# Patient Record
Sex: Male | Born: 1985 | Race: Black or African American | Hispanic: No | Marital: Married | Smoking: Never smoker
Health system: Southern US, Community
[De-identification: ages and names within clinical notes are randomized; demographics above are authoritative.]

## PROBLEM LIST (undated history)

## (undated) DIAGNOSIS — I517 Cardiomegaly: Secondary | ICD-10-CM

## (undated) HISTORY — DX: Cardiomegaly: I51.7

---
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
## 2007-02-27 ENCOUNTER — Telehealth: Payer: Self-pay | Admitting: *Deleted

## 2007-08-02 ENCOUNTER — Ambulatory Visit: Payer: Self-pay | Admitting: Family Medicine

## 2007-08-02 ENCOUNTER — Encounter (INDEPENDENT_AMBULATORY_CARE_PROVIDER_SITE_OTHER): Payer: Self-pay | Admitting: Family Medicine

## 2007-08-02 ENCOUNTER — Ambulatory Visit (HOSPITAL_COMMUNITY): Admission: RE | Admit: 2007-08-02 | Discharge: 2007-08-02 | Payer: Self-pay | Admitting: Family Medicine

## 2007-08-03 ENCOUNTER — Telehealth (INDEPENDENT_AMBULATORY_CARE_PROVIDER_SITE_OTHER): Payer: Self-pay | Admitting: Family Medicine

## 2007-08-03 ENCOUNTER — Ambulatory Visit: Payer: Self-pay | Admitting: Family Medicine

## 2007-08-03 ENCOUNTER — Ambulatory Visit: Payer: Self-pay | Admitting: Cardiology

## 2007-08-03 LAB — CONVERTED CEMR LAB
Chlamydia, DNA Probe: POSITIVE — AB
GC Probe Amp, Genital: NEGATIVE

## 2007-08-08 ENCOUNTER — Ambulatory Visit (HOSPITAL_COMMUNITY): Admission: RE | Admit: 2007-08-08 | Discharge: 2007-08-08 | Payer: Self-pay | Admitting: Family Medicine

## 2007-08-08 ENCOUNTER — Encounter (INDEPENDENT_AMBULATORY_CARE_PROVIDER_SITE_OTHER): Payer: Self-pay | Admitting: Family Medicine

## 2008-08-24 ENCOUNTER — Emergency Department (HOSPITAL_COMMUNITY): Admission: EM | Admit: 2008-08-24 | Discharge: 2008-08-24 | Payer: Self-pay | Admitting: Emergency Medicine

## 2008-09-10 ENCOUNTER — Emergency Department (HOSPITAL_COMMUNITY): Admission: EM | Admit: 2008-09-10 | Discharge: 2008-09-11 | Payer: Self-pay | Admitting: Emergency Medicine

## 2009-03-17 IMAGING — CR DG TIBIA/FIBULA 2V*L*
4 series · 4 of 4 positions shown · non-contrast
Comparison: None.

LEFT TIBIA AND FIBULA - 2  VIEW:

CLINICAL DATA: Bilateral tibial pain in a tract runner. Evaluate for stress
fracture.

[t tib/fib ap left (1 of 2)]
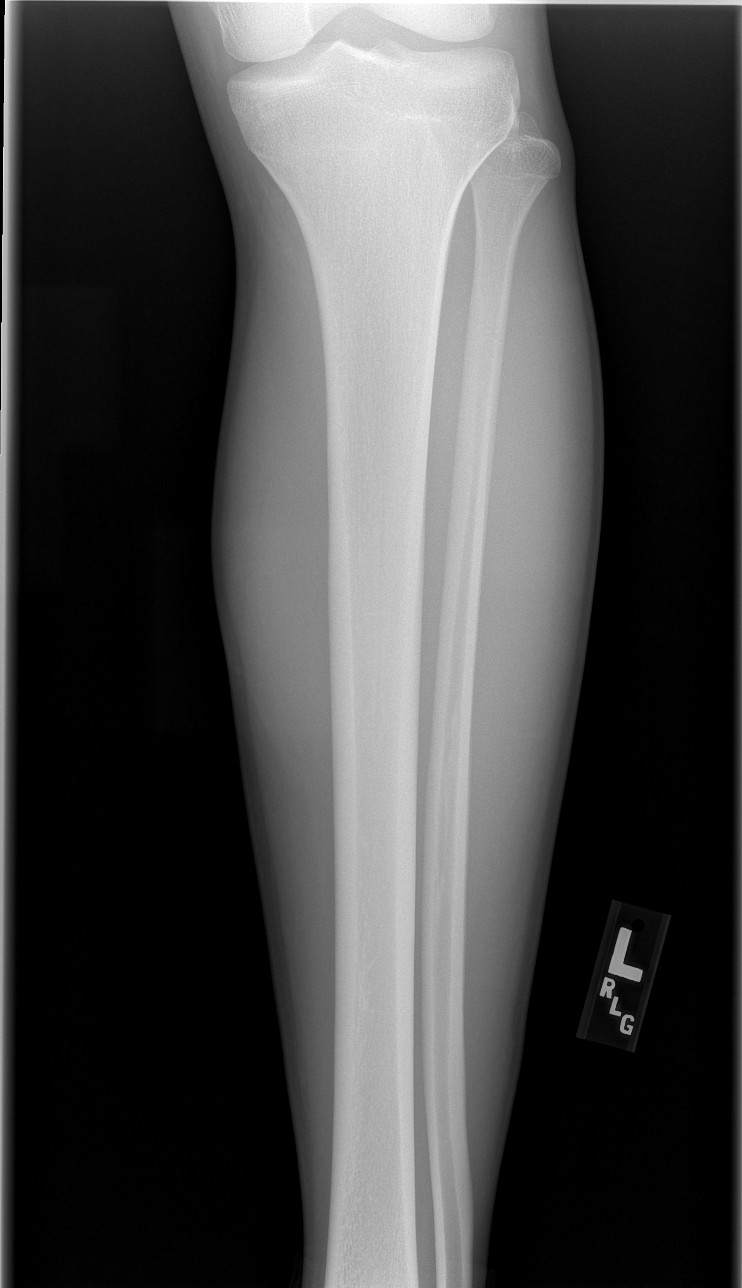

[t tib/fib ap left (2 of 2)]
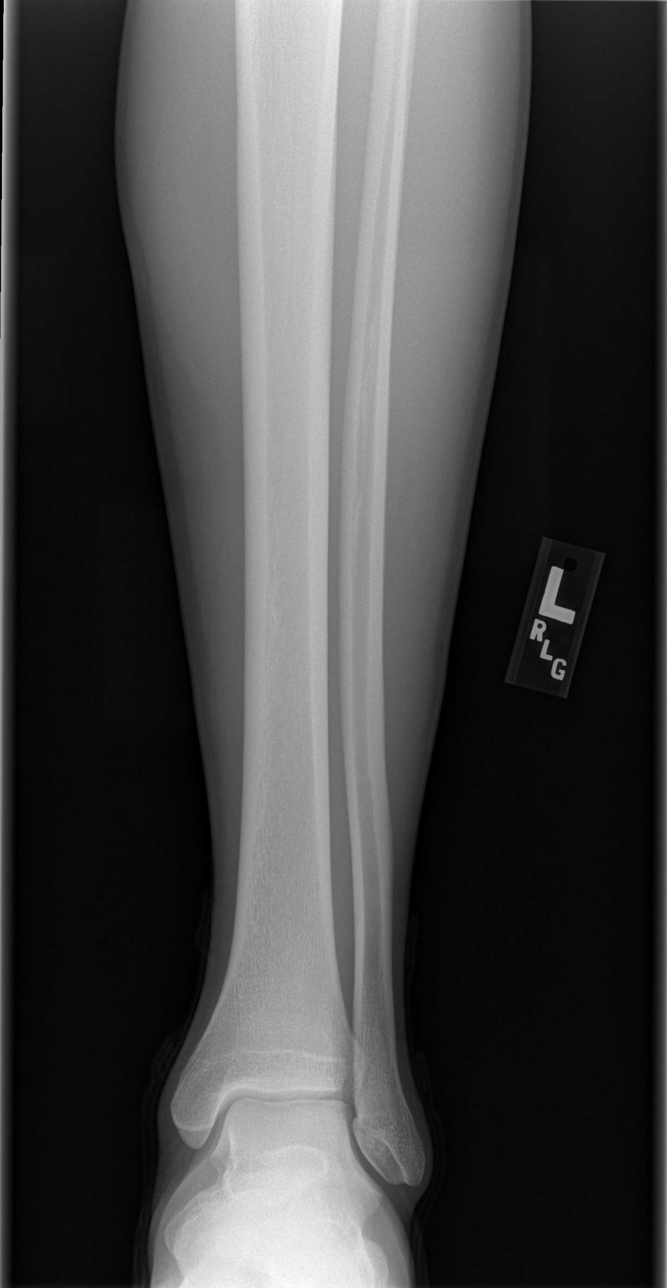

[t tib/fib lat left (1 of 2)]
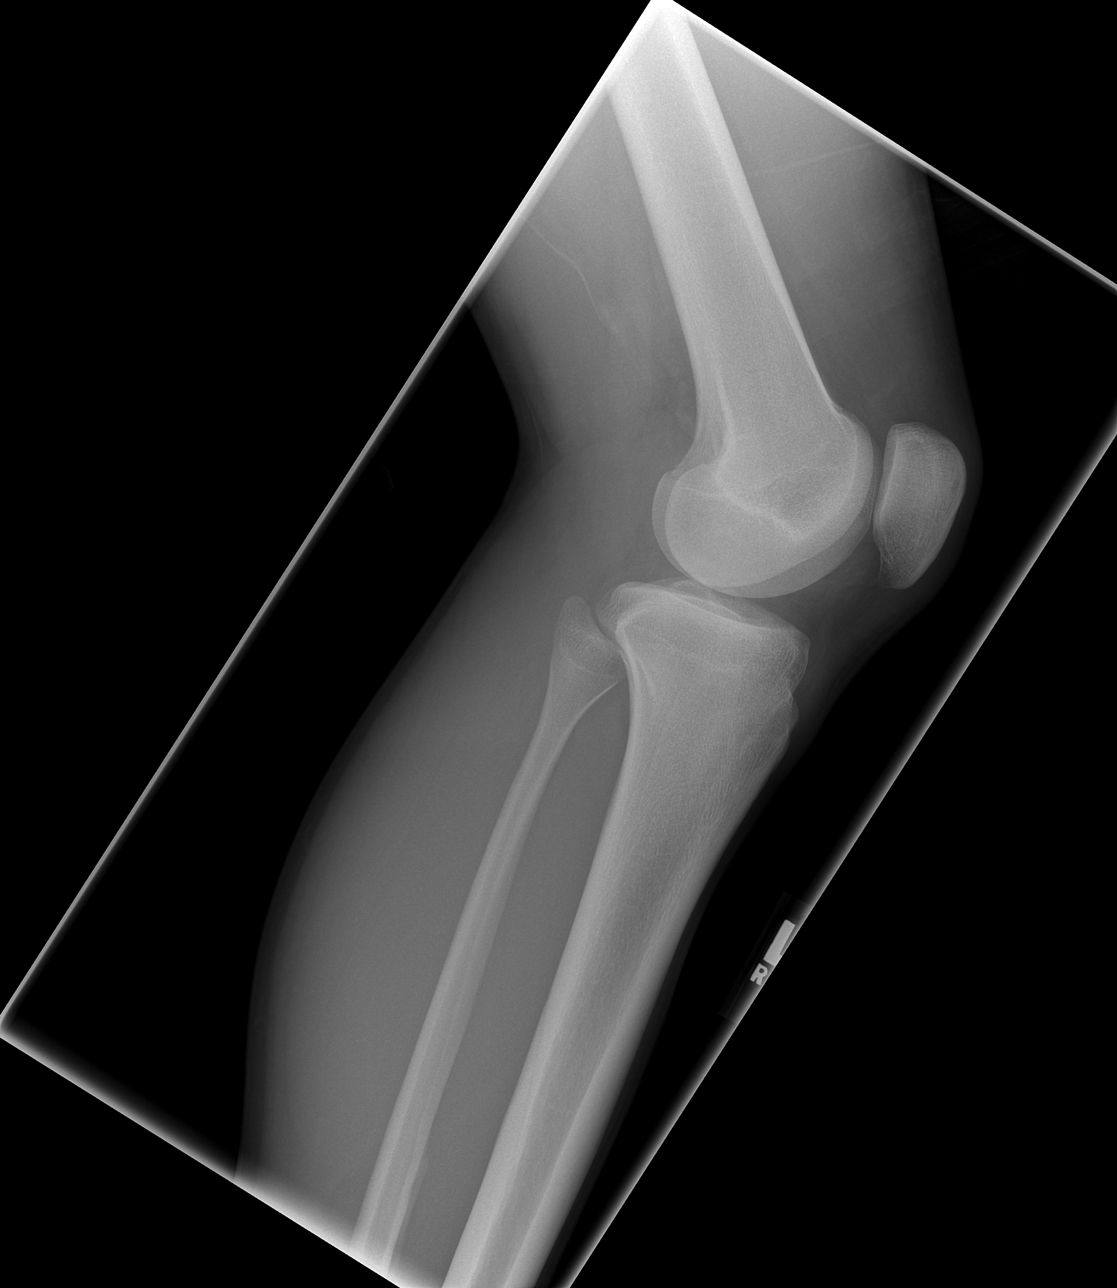

[t tib/fib lat left (2 of 2)]
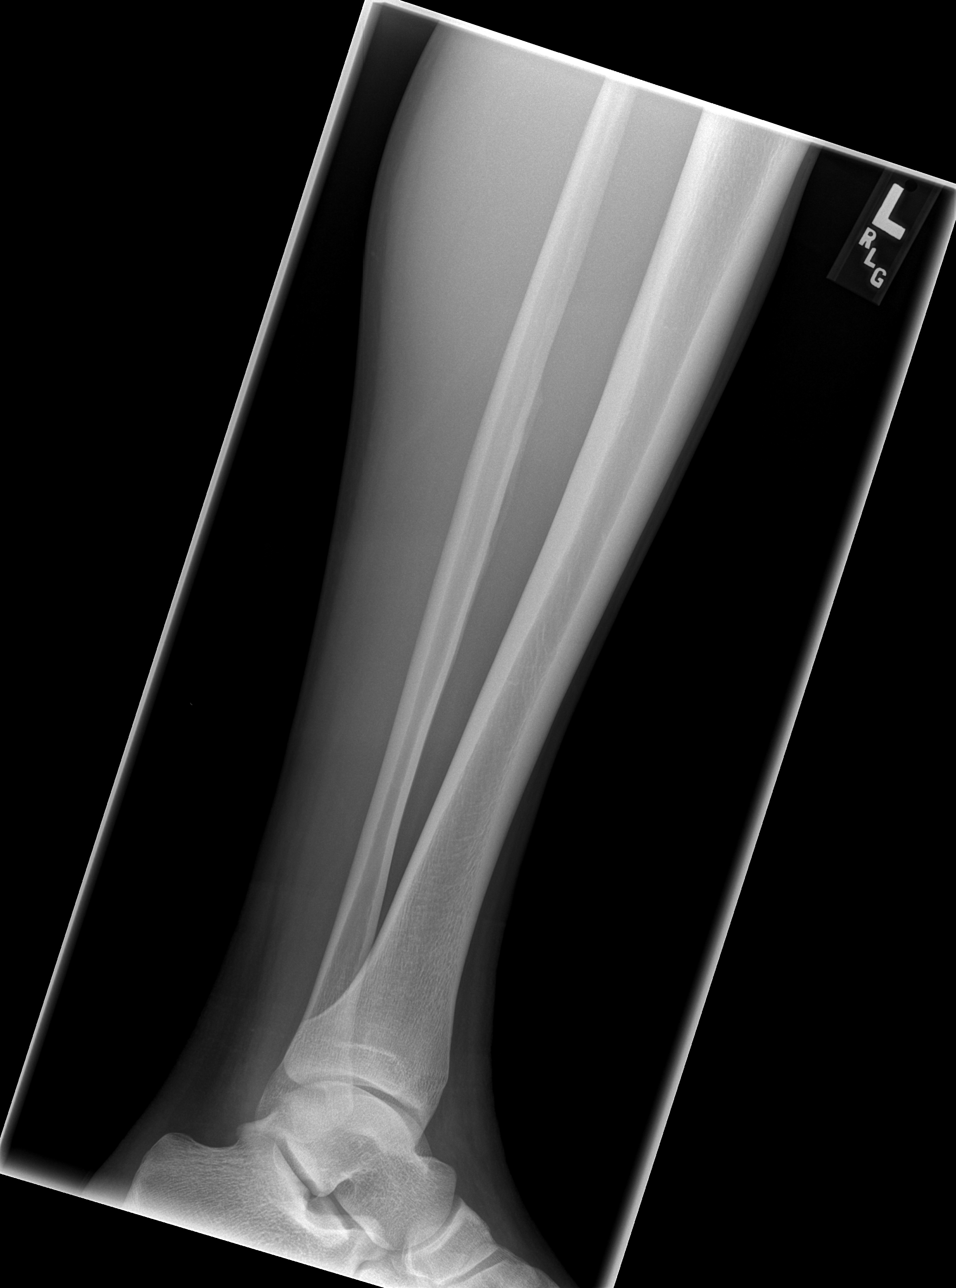

[4 of 4 positions shown; findings below may reference images not displayed]

FINDINGS: No evidence for acute fracture. No linear sclerosis to suggest the
presence of a stress fracture. No evidence for unexpected periosteal reaction.
IMPRESSION: No acute bony findings.

## 2009-06-11 ENCOUNTER — Ambulatory Visit: Payer: Self-pay | Admitting: Family Medicine

## 2009-06-11 ENCOUNTER — Encounter: Payer: Self-pay | Admitting: *Deleted

## 2009-06-11 DIAGNOSIS — S93409A Sprain of unspecified ligament of unspecified ankle, initial encounter: Secondary | ICD-10-CM | POA: Insufficient documentation

## 2010-05-30 ENCOUNTER — Encounter: Payer: Self-pay | Admitting: Family Medicine

## 2010-06-08 NOTE — Assessment & Plan Note (Signed)
Summary: sprained ankle,df   Vital Signs:  Patient profile:   25 year old male Height:      71 inches Weight:      152.4 pounds BMI:     21.33 Temp:     97.5 degrees F oral Pulse rate:   58 / minute BP sitting:   115 / 75  (left arm) Cuff size:   regular  Vitals Entered By: Gladstone Pih (June 11, 2009 11:04 AM) CC: C/O Right ankle ? sprain Is Patient Diabetic? No Pain Assessment Patient in pain? yes     Location: foot Intensity: 8 Type: sharp Comments Played BB last night and came down on someone elses foot and rolled ankle, very painful today   CC:  C/O Right ankle ? sprain.  History of Present Illness: Inversion injury yest afternoon while playing bball. came down on someone elses foot. Laced his shoe tighter and  continued to play, scoring 17 more points. Sore today but can walk on it. No prior ankle surgery  Habits & Providers  Alcohol-Tobacco-Diet     Tobacco Status: never  Allergies: No Known Drug Allergies  Physical Exam  Additional Exam:  r ankl;e mild swelling deltoid ligament area. Antyerior drawer stable, Mild pain w inversion. diffusely tender but no tru bony tenderness on malleoli ir fifth MT, No bruising.   Impression & Recommendations:  Problem # 1:  ANKLE SPRAIN, RIGHT (ICD-845.00)  will ace wrap for compression and comfort today. ICE, otc ibuprofen. I expect him to fully resolve in next 2-3 days--if not he is to RTC  Orders: [facility]- Est Level  3 ([id number])

## 2010-06-08 NOTE — Letter (Signed)
Summary: Out of Work  [facility]  [address]   [place], [place] [address]   Phone: [phone]  Fax: [phone]    June 11, 2009   Employee:  WACEY ZIEGER    To Whom It May Concern:   For Medical reasons, please excuse the above named employee from work for the following dates:  Start:   06/11/2009      End:   06/15/2009  If you need additional information, please feel free to contact our office.         Sincerely,    Loralee Pacas CMA

## 2011-02-26 ENCOUNTER — Emergency Department (HOSPITAL_COMMUNITY)
Admission: EM | Admit: 2011-02-26 | Discharge: 2011-02-26 | Disposition: A | Discharge status: Home / Self Care | Payer: Self-pay | Attending: Emergency Medicine | Admitting: Emergency Medicine

## 2011-02-26 ENCOUNTER — Emergency Department (HOSPITAL_COMMUNITY): Payer: Self-pay

## 2011-02-26 DIAGNOSIS — S62319A Displaced fracture of base of unspecified metacarpal bone, initial encounter for closed fracture: Secondary | ICD-10-CM | POA: Insufficient documentation

## 2011-02-26 DIAGNOSIS — Y9364 Activity, baseball: Secondary | ICD-10-CM | POA: Insufficient documentation

## 2011-02-26 DIAGNOSIS — W219XXA Striking against or struck by unspecified sports equipment, initial encounter: Secondary | ICD-10-CM | POA: Insufficient documentation

## 2011-02-26 DIAGNOSIS — Y998 Other external cause status: Secondary | ICD-10-CM | POA: Insufficient documentation

## 2011-03-01 ENCOUNTER — Inpatient Hospital Stay (HOSPITAL_COMMUNITY)
Admission: RE | Admit: 2011-03-01 | Discharge: 2011-03-01 | Disposition: A | Discharge status: Home / Self Care | Payer: Self-pay | Source: Ambulatory Visit | Attending: Family Medicine | Admitting: Family Medicine

## 2013-11-26 ENCOUNTER — Emergency Department (HOSPITAL_COMMUNITY)
Admission: EM | Admit: 2013-11-26 | Discharge: 2013-11-26 | Disposition: A | Discharge status: Home / Self Care | Payer: BC Managed Care – PPO | Attending: Emergency Medicine | Admitting: Emergency Medicine

## 2013-11-26 ENCOUNTER — Emergency Department (HOSPITAL_COMMUNITY): Payer: BC Managed Care – PPO

## 2013-11-26 ENCOUNTER — Encounter (HOSPITAL_COMMUNITY): Payer: Self-pay | Admitting: Emergency Medicine

## 2013-11-26 DIAGNOSIS — X58XXXA Exposure to other specified factors, initial encounter: Secondary | ICD-10-CM | POA: Insufficient documentation

## 2013-11-26 DIAGNOSIS — Z79899 Other long term (current) drug therapy: Secondary | ICD-10-CM | POA: Insufficient documentation

## 2013-11-26 DIAGNOSIS — S335XXA Sprain of ligaments of lumbar spine, initial encounter: Secondary | ICD-10-CM | POA: Insufficient documentation

## 2013-11-26 DIAGNOSIS — S39012A Strain of muscle, fascia and tendon of lower back, initial encounter: Secondary | ICD-10-CM

## 2013-11-26 DIAGNOSIS — Y929 Unspecified place or not applicable: Secondary | ICD-10-CM | POA: Insufficient documentation

## 2013-11-26 DIAGNOSIS — Y939 Activity, unspecified: Secondary | ICD-10-CM | POA: Insufficient documentation

## 2013-11-26 MED ORDER — KETOROLAC TROMETHAMINE 10 MG PO TABS
10.0000 mg | ORAL_TABLET | Freq: Once | ORAL | Status: AC
  Administered 2013-11-26: 10 mg via ORAL
  Filled 2013-11-26: qty 1

## 2013-11-26 MED ORDER — HYDROCODONE-ACETAMINOPHEN 5-325 MG PO TABS
2.0000 | ORAL_TABLET | Freq: Once | ORAL | Status: AC
  Administered 2013-11-26: 2 via ORAL
  Filled 2013-11-26: qty 2

## 2013-11-26 MED ORDER — KETOROLAC TROMETHAMINE 10 MG PO TABS: 10.0000 mg | ORAL_TABLET | Freq: Three times a day (TID) | ORAL | Status: DC

## 2013-11-26 MED ORDER — METHOCARBAMOL 500 MG PO TABS: 500.0000 mg | ORAL_TABLET | Freq: Three times a day (TID) | ORAL | Status: DC

## 2013-11-26 MED ORDER — HYDROCODONE-ACETAMINOPHEN 5-325 MG PO TABS: 1.0000 | ORAL_TABLET | ORAL | Status: DC | PRN

## 2013-11-26 MED ORDER — DIAZEPAM 5 MG PO TABS
5.0000 mg | ORAL_TABLET | Freq: Once | ORAL | Status: AC
  Administered 2013-11-26: 5 mg via ORAL
  Filled 2013-11-26: qty 1

## 2013-11-26 NOTE — Discharge Instructions (Signed)
The x-ray of your back is negative for any fracture, or any changes and disc space. Suspect that you have a muscle strain involving your lumbar region. Please rest your back is much as possible. Please apply heat to your lower back. Please use Toradol and Robaxin daily. Use Norco for pain if needed. Norco and Robaxin may cause drowsiness, please use with caution. Please see your primary physician for additional evaluation and possible MRI if you're back pain continues. Back Pain, Adult Low back pain is very common. About 1 in 5 people have back pain.The cause of low back pain is rarely dangerous. The pain often gets better over time.About half of people with a sudden onset of back pain feel better in just 2 weeks. About 8 in 10 people feel better by 6 weeks.  CAUSES Some common causes of back pain include:  Strain of the muscles or ligaments supporting the spine.  Wear and tear (degeneration) of the spinal discs.  Arthritis.  Direct injury to the back. DIAGNOSIS Most of the time, the direct cause of low back pain is not known.However, back pain can be treated effectively even when the exact cause of the pain is unknown.Answering your caregiver's questions about your overall health and symptoms is one of the most accurate ways to make sure the cause of your pain is not dangerous. If your caregiver needs more information, he or she may order lab work or imaging tests (X-rays or MRIs).However, even if imaging tests show changes in your back, this usually does not require surgery. HOME CARE INSTRUCTIONS For many people, back pain returns.Since low back pain is rarely dangerous, it is often a condition that people can learn to [facility]manageon their own.   Remain active. It is stressful on the back to sit or stand in one place. Do not sit, drive, or stand in one place for more than 30 minutes at a time. Take short walks on level surfaces as soon as pain allows.Try to increase the length of time you walk  each day.  Do not stay in bed.Resting more than 1 or 2 days can delay your recovery.  Do not avoid exercise or work.Your body is made to move.It is not dangerous to be active, even though your back may hurt.Your back will likely heal faster if you return to being active before your pain is gone.  Pay attention to your body when you bend and lift. Many people have less discomfortwhen lifting if they bend their knees, keep the load close to their bodies,and avoid twisting. Often, the most comfortable positions are those that put less stress on your recovering back.  Find a comfortable position to sleep. Use a firm mattress and lie on your side with your knees slightly bent. If you lie on your back, put a pillow under your knees.  Only take over-the-counter or prescription medicines as directed by your caregiver. Over-the-counter medicines to reduce pain and inflammation are often the most helpful.Your caregiver may prescribe muscle relaxant drugs.These medicines help dull your pain so you can more quickly return to your normal activities and healthy exercise.  Put ice on the injured area.  Put ice in a plastic bag.  Place a towel between your skin and the bag.  Leave the ice on for 15-20 minutes, 03-04 times a day for the first 2 to 3 days. After that, ice and heat may be alternated to reduce pain and spasms.  Ask your caregiver about trying back exercises and gentle massage.  This may be of some benefit.  Avoid feeling anxious or stressed.Stress increases muscle tension and can worsen back pain.It is important to recognize when you are anxious or stressed and learn ways to manage it.Exercise is a great option. SEEK MEDICAL CARE IF:  You have pain that is not relieved with rest or medicine.  You have pain that does not improve in 1 week.  You have new symptoms.  You are generally not feeling well. SEEK IMMEDIATE MEDICAL CARE IF:   You have pain that radiates from your back  into your legs.  You develop new bowel or bladder control problems.  You have unusual weakness or numbness in your arms or legs.  You develop nausea or vomiting.  You develop abdominal pain.  You feel faint. Document Released: 04/25/2005 Document Revised: 10/25/2011 Document Reviewed: 09/13/2010 [facility] Patient Information 2015 [place], [place]. This information is not intended to replace advice given to you by your health care provider. Make sure you discuss any questions you have with your health care provider.

## 2013-11-26 NOTE — ED Notes (Signed)
Lower right back pain for 2 weeks, worse with movement or sitting.

## 2013-11-26 NOTE — ED Notes (Signed)
Pt verbalized understanding to use caution and no driving within 4 hours of taking vicodin due to med causes drowsiness  

## 2013-11-27 NOTE — ED Provider Notes (Signed)
CSN: [id number]     Arrival date & time 11/26/13  1939 History   First MD Initiated Contact with Patient 11/26/13 2020     Chief Complaint  Patient presents with  . Back Pain     (Consider location/radiation/quality/duration/timing/severity/associated sxs/prior Treatment) Patient is a 28 y.o. male presenting with back pain. The history is provided by the patient.  Back Pain Location:  Lumbar spine Quality:  Shooting (sharpe pain reported) Radiates to:  Does not radiate Pain severity:  Moderate Pain is:  Same all the time Onset quality:  Gradual Duration:  2 weeks Timing:  Intermittent Progression:  Unchanged Context comment:  No reported injury or recent  problem.  Relieved by:  Nothing Worsened by:  Movement Associated symptoms: no abdominal pain, no bladder incontinence, no bowel incontinence, no chest pain and no dysuria   Risk factors: no recent surgery     History reviewed. No pertinent past medical history. History reviewed. No pertinent past surgical history. No family history on file. History  Substance Use Topics  . Smoking status: Never Smoker   . Smokeless tobacco: Not on file  . Alcohol Use: No    Review of Systems  Constitutional: Negative for activity change.       All ROS Neg except as noted in HPI  HENT: Negative for nosebleeds.   Eyes: Negative for photophobia and discharge.  Respiratory: Negative for cough, shortness of breath and wheezing.   Cardiovascular: Negative for chest pain and palpitations.  Gastrointestinal: Negative for abdominal pain, blood in stool and bowel incontinence.  Genitourinary: Negative for bladder incontinence, dysuria, frequency and hematuria.  Musculoskeletal: Positive for back pain. Negative for arthralgias and neck pain.  Skin: Negative.   Neurological: Negative for dizziness, seizures and speech difficulty.  Psychiatric/Behavioral: Negative for hallucinations and confusion.      Allergies  Review of patient's  allergies indicates no known allergies.  Home Medications   Prior to Admission medications   Medication Sig Start Date End Date Taking? Authorizing Provider  HYDROcodone-acetaminophen (NORCO/VICODIN) 5-325 MG per tablet Take 1 tablet by mouth every 4 (four) hours as needed. 11/26/13   Kathie Dike, PA-C  ketorolac (TORADOL) 10 MG tablet Take 1 tablet (10 mg total) by mouth 3 (three) times daily. 11/26/13   Kathie Dike, PA-C  methocarbamol (ROBAXIN) 500 MG tablet Take 1 tablet (500 mg total) by mouth 3 (three) times daily. 11/26/13   Kathie Dike, PA-C   BP 125/88  Pulse 81  Temp(Src) 98.9 F (37.2 C) (Oral)  Resp 16  Ht 6\' 1"  (1.854 m)  Wt 180 lb (81.647 kg)  BMI 23.75 kg/m2  SpO2 97% Physical Exam  Nursing note and vitals reviewed. Constitutional: He is oriented to person, place, and time. He appears well-developed and well-nourished.  Non-toxic appearance.  HENT:  Head: Normocephalic.  Right Ear: Tympanic membrane and external ear normal.  Left Ear: Tympanic membrane and external ear normal.  Eyes: EOM and lids are normal. Pupils are equal, round, and reactive to light.  Neck: Normal range of motion. Neck supple. Carotid bruit is not present.  Cardiovascular: Normal rate, regular rhythm, normal heart sounds, intact distal pulses and normal pulses.   Pulmonary/Chest: Breath sounds normal. No respiratory distress.  Abdominal: Soft. Bowel sounds are normal. There is no tenderness. There is no guarding.  Musculoskeletal:       Lumbar back: He exhibits decreased range of motion, tenderness, pain and spasm.  Lymphadenopathy:       Head (  right side): No submandibular adenopathy present.       Head (left side): No submandibular adenopathy present.    He has no cervical adenopathy.  Neurological: He is alert and oriented to person, place, and time. He has normal strength. No cranial nerve deficit or sensory deficit.  Skin: Skin is warm and dry.  Psychiatric: He has a normal  mood and affect. His speech is normal.    ED Course  Procedures (including critical care time) Labs Review Labs Reviewed - No data to display  Imaging Review Dg Lumbar Spine Complete  11/26/2013   CLINICAL DATA:  Right low back pain, no injury  EXAM: LUMBAR SPINE - COMPLETE 4+ VIEW  COMPARISON:  None.  FINDINGS: Normal anterior-posterior alignment. No significant degenerative change. No fracture. No soft tissue abnormalities.  IMPRESSION: Negative.   Electronically Signed   By: Esperanza Heiraymond  Rubner M.D.   On: 11/26/2013 21:27     EKG Interpretation None      MDM X-ray of the lower back and is negative for any fracture or significant degenerative change. The vital signs are within normal limits. The pulse oximetry is 97% on room air. No acute gross neurologic deficit appreciated at this time. The patient will be treated with Norco, Toradol, and Robaxin. Patient is to return to the primary care physician office for recheck and evaluation.    Final diagnoses:  Lumbar strain, initial encounter    **I have reviewed nursing notes, vital signs, and all appropriate lab and imaging results for this patient.Kathie Dike*    Burma Ketcher M Caya Soberanis, PA-C 11/27/13 0140

## 2013-11-30 NOTE — ED Provider Notes (Signed)
Medical screening examination/treatment/procedure(s) were performed by non-physician practitioner and as supervising physician I was immediately available for consultation/collaboration.   EKG Interpretation None       Zerick Prevette M Pavneet Markwood, MD 11/30/13 0058 

## 2015-02-16 ENCOUNTER — Encounter (HOSPITAL_COMMUNITY): Payer: Self-pay | Admitting: Emergency Medicine

## 2015-02-16 ENCOUNTER — Emergency Department (HOSPITAL_COMMUNITY): Payer: 59

## 2015-02-16 ENCOUNTER — Emergency Department (HOSPITAL_COMMUNITY)
Admission: EM | Admit: 2015-02-16 | Discharge: 2015-02-16 | Disposition: A | Discharge status: Home / Self Care | Payer: 59 | Attending: Emergency Medicine | Admitting: Emergency Medicine

## 2015-02-16 DIAGNOSIS — Z79899 Other long term (current) drug therapy: Secondary | ICD-10-CM | POA: Insufficient documentation

## 2015-02-16 DIAGNOSIS — M79672 Pain in left foot: Secondary | ICD-10-CM | POA: Insufficient documentation

## 2015-02-16 MED ORDER — IBUPROFEN 800 MG PO TABS: 800.0000 mg | ORAL_TABLET | Freq: Three times a day (TID) | ORAL | Status: DC

## 2015-02-16 NOTE — ED Notes (Signed)
Per pt, states a large guy fell on him 3 weeks ago while playing baseball

## 2015-02-16 NOTE — Discharge Instructions (Signed)
1. Medications: ibuprofen, usual home medications 2. Treatment: rest, drink plenty of fluids, ice, elevate 3. Follow Up: please followup with orthopedics if your symptoms do not improve for discussion of your diagnoses and further evaluation after today's visit; please return to the ER for severe pain or swelling, numbness, color change, new or worsening symptoms   Musculoskeletal Pain Musculoskeletal pain is muscle and boney aches and pains. These pains can occur in any part of the body. Your caregiver may treat you without knowing the cause of the pain. They may treat you if blood or urine tests, X-rays, and other tests were normal.  CAUSES There is often not a definite cause or reason for these pains. These pains may be caused by a type of germ (virus). The discomfort may also come from overuse. Overuse includes working out too hard when your body is not fit. Boney aches also come from weather changes. Bone is sensitive to atmospheric pressure changes. HOME CARE INSTRUCTIONS   Ask when your test results will be ready. Make sure you get your test results.  Only take over-the-counter or prescription medicines for pain, discomfort, or fever as directed by your caregiver. If you were given medications for your condition, do not drive, operate machinery or power tools, or sign legal documents for 24 hours. Do not drink alcohol. Do not take sleeping pills or other medications that may interfere with treatment.  Continue all activities unless the activities cause more pain. When the pain lessens, slowly resume normal activities. Gradually increase the intensity and duration of the activities or exercise.  During periods of severe pain, bed rest may be helpful. Lay or sit in any position that is comfortable.  Putting ice on the injured area.  Put ice in a bag.  Place a towel between your skin and the bag.  Leave the ice on for 15 to 20 minutes, 3 to 4 times a day.  Follow up with your caregiver  for continued problems and no reason can be found for the pain. If the pain becomes worse or does not go away, it may be necessary to repeat tests or do additional testing. Your caregiver may need to look further for a possible cause. SEEK IMMEDIATE MEDICAL CARE IF:  You have pain that is getting worse and is not relieved by medications.  You develop chest pain that is associated with shortness or breath, sweating, feeling sick to your stomach (nauseous), or throw up (vomit).  Your pain becomes localized to the abdomen.  You develop any new symptoms that seem different or that concern you. MAKE SURE YOU:   Understand these instructions.  Will watch your condition.  Will get help right away if you are not doing well or get worse.   This information is not intended to replace advice given to you by your health care provider. Make sure you discuss any questions you have with your health care provider.   Document Released: 04/25/2005 Document Revised: 07/18/2011 Document Reviewed: 12/28/2012 Elsevier Interactive Patient Education 2016 [employer].   Emergency Department Resource Guide 1) Find a Doctor and Pay Out of Pocket Although you won't have to find out who is covered by your insurance plan, it is a good idea to ask around and get recommendations. You will then need to call the office and see if the doctor you have chosen will accept you as a new patient and what types of options they offer for patients who are self-pay. Some doctors offer discounts or  will set up payment plans for their patients who do not have insurance, but you will need to ask so you aren't surprised when you get to your appointment.  2) Contact Your Local Health Department Not all health departments have doctors that can see patients for sick visits, but many do, so it is worth a call to see if yours does. If you don't know where your local health department is, you can check in your phone book. The CDC also has  a tool to help you locate your state's health department, and many state websites also have listings of all of their local health departments.  3) Find a Walk-in Clinic If your illness is not likely to be very severe or complicated, you may want to try a walk in clinic. These are popping up all over the country in pharmacies, drugstores, and shopping centers. They're usually staffed by nurse practitioners or physician assistants that have been trained to treat common illnesses and complaints. They're usually fairly quick and inexpensive. However, if you have serious medical issues or chronic medical problems, these are probably not your best option.  No Primary Care Doctor: - Call Health Connect at  [phone] - they can help you locate a primary care doctor that  accepts your insurance, provides certain services, etc. - Physician Referral Service- [phone]  Chronic Pain Problems: Organization         Address  Phone   Notes  [facility]  [phone] Patients need to be referred by their primary care doctor.   Medication Assistance: Organization         Address  Phone   Notes  [facility] Medication [facility] [address] [place] [place], [place] [address] [phone] --Must be a resident of [facility] -- Must have NO insurance coverage whatsoever (no Medicaid/ Medicare, etc.) -- The pt. MUST have a primary care doctor that directs their care regularly and follows them in the community   [facility]  [phone]   [employer]  [phone]    Agencies that provide inexpensive medical care: Organization         Address  Phone   Notes  [facility]  [phone]   [facility]    [phone]   [facility] [address] [place], [place] [address] [phone]   [facility] [address] [place] [place] [phone]   Planned Parenthood    [phone]    [facility]    [phone]   [facility]  [address], [place] Phone:  [phone], Fax:  [phone] Hours of Operation:  9 am - 6 pm, M-F.  Also accepts Medicaid/Medicare and self-pay.  [facility]  [address], [place] Phone: [phone], Fax: [phone]. Hours of Operation:  8:30 am - 5:30 pm, M-F.  Also accepts Medicaid and self-pay.  [facility] [place] [address], [place] Phone: [phone]   [facility] [address] Natasha Bence [place], [place] [phone], Ext. [phone] Mondays & Thursdays: 7-9 AM.  First 15 patients are seen on a first come, first serve basis.    Medicaid-accepting [facility] Providers:  Organization         Address  Phone   Notes  [facility] [address], [place] [phone])  [phone] Also accepts self-pay patients.  [facility] [address] Laurell Josephs [place], [place]  [phone]   [facility] [address], [place] [phone]   [facility] [address] [place] [phone]   Renaye Rakers [address], [place]   [phone] Only accepts [place] Access [place] patients after they have their name applied to their card.   Self-Pay (no insurance) in [facility]:  Organization         Address  Phone   Notes  Sickle Cell Patients, [facility] [address] [place], [place] [phone]   [facility] [address] [place], [place] [phone]   [facility] [place]  [address], [place] [phone]   [facility]/Dr. Osei-Bonsu  [address], [place] or [address], [place] [phone] Phone number for both [place] and [place] locations is the same.  Urgent Medical and [facility] [address] [place] [phone]   [facility] [address] [place] or [address] [phone]   [facility] [address] [place] [phone], phone; [phone], fax Sees patients 1st and 3rd Saturday of every month.  Must not qualify for public or private insurance (i.e. Medicaid, Medicare, [place] Health Choice, Veterans' Benefits)  Household income should be no more than 200% of the poverty level The clinic cannot treat you if you are pregnant or think you are pregnant  Sexually transmitted diseases are not treated at the clinic.    Dental Care: Organization         Address  Phone  Notes  [facility] Department of [facility] [address] [place], [place] [phone] Accepts children up to age 26 who are enrolled in [place] or [place] Health Choice; pregnant women with a Medicaid card; and children who have applied for Medicaid or [place] Health Choice, but were declined, whose parents can pay a reduced fee at time of service.  [facility] Department of [facility]  [address], [place] [phone] Accepts children up to age 47 who are enrolled in [place] or [place] Health Choice; pregnant women with a Medicaid card; and children who have applied for Medicaid or [place] Health Choice, but were declined, whose parents can pay a reduced fee at time of service.  [facility]  [address] [place], [place] [phone] Patients are seen by appointment only. Walk-ins are not accepted. [facility] will see patients 69 years of age and older. Monday - Tuesday (8am-5pm) Most Wednesdays (8:30-5pm) $30 per visit, cash only  [facility] Adult Dental Access PROGRAM  [address], [facility] [phone] Patients are seen by appointment only. Walk-ins are not accepted. [facility] will see patients 74 years of age and older. One Wednesday  Evening (Monthly: Volunteer Based).  $30 per visit, cash only  [employer]  [phone] for adults; Children under age 34, call Graduate Pediatric Dentistry at [phone]. Children aged 31-14, please call [phone] to request a pediatric application.  Dental services are provided in all areas of dental care including fillings, crowns and bridges, complete and partial dentures, implants, gum treatment, root canals, and extractions. Preventive care is also provided.  Treatment is provided to both adults and children. Patients are selected via a lottery and there is often a waiting list.   [facility] [address], [place]  [phone] [email or web address]   [facility] [address] [place], [place] [phone], Ext. [phone] Second and Fourth Thursday of each month, opens at 6:30 AM; Clinic ends at 9 AM.  Patients are seen on a first-come first-served basis, and a limited number are seen during each clinic.   [facility]  [address] Ether Griffins [place], [place] [phone]   Eligibility Requirements You must have lived in [place], [place], or [place] counties for at least the last three months.   You cannot be eligible for state or federal sponsored [place], including [employer], [place], or [employer].   You generally cannot be eligible for healthcare insurance through your employer.    How to apply: Eligibility screenings are held every Tuesday and Wednesday afternoon from 1:00 pm until 4:00 pm. You do not need an appointment for the interview!  [facility] [address] [place], [place] [phone]   [facility] Health Department  [phone]   [facility] Health Department  [phone]   [facility] Health Department  [phone]    Behavioral Health Resources in the Community: Intensive Outpatient Programs Organization         Address  Phone  Notes  [facility] [address], [place], [place] [phone]   [facility] [place] [facility] Out[facility] [address] [place], [place] [phone]   [facility] [address] [place], [place]  [phone]   [facility] [address],  [place], [place] [phone] or [phone]   Substance Abuse Resources Organization         Address  Phone  Notes  [facility]  [phone]   [facility]  [phone]   [facility] [place]  [phone]   Floydene Flock  [phone]   Residential & Outpatient Substance Abuse Program  [phone]   Psychological Services Organization         Address  Phone  Notes  [facility] Behavioral Health  [phone]   [facility]  [phone]   [facility] Mental Health [address] [place] [phone] or [phone]    Mobile Crisis Teams Organization         Address  Phone  Notes  [facility], Mobile Crisis Care Unit  [phone]   [facility]  [address] [place], [place] [phone]   Doristine Locks [address] [place] [phone]    Self-Help/Support Groups Organization         Address  Phone             Notes  [facility] [place] - variety of support groups  336- I7437963 Call for more information  Narcotics Anonymous (NA), [facility] [address], [employer] [place]  2 meetings at this location   Statistician         Address  Phone  Notes  [facility] [address],    [place]  [phone]   [facility]  [address], [place] [address], [place], [place] [phone]   [facility] [address] [place], [place] [phone] Admissions: 8am-3pm M-F  [facility] [address]    [place],  [place] [phone]   [facility] [address] Starling Manns  [place], [place] [phone]   [facility] [address]  [place], [place] [phone]   [facility] - Intensive Outpatient [address] Laurell Josephs [address], [place], [place] [phone]   [facility] ([facility] [address] [place]  [place], [place] [phone] or 8548795182   [facility] ([facility]) [address] [place], [place] [phone] Accepts Medicaid  [facility] [address]  [place] [phone] Substance Abuse/Addiction Treatment   [facility] Organization         Address  Phone  Notes  [facility]  5717139296   Angie Fava, PhD [address] Ervin Knack [place], [place]   [phone] or [phone]   [facility]   [address] [place], [place] [phone]   [facility] [address] [place], [place] [phone] Insurance/Medicaid/sponsorship through [facility] and Families [address]                                    [place], [place] [phone] Therapy/tele-psych/case  [facility] [address], [place] [phone]    Dr. Lolly Mustache  [phone]   [facility]  [employer] [facility] Dept. 1) [address] [place] 2) [address], [place] 3)  [address], [place] [phone]  [phone]   [facility] Child Abuse Hotline (863)082-4781 or [phone] (After Hours)

## 2015-02-16 NOTE — ED Provider Notes (Signed)
CSN: [id number]     Arrival date & time 02/16/15  1332 History  By signing my name below, I, Elon Spanner, attest that this documentation has been prepared under the direction and in the presence of Glean Hess, [place]. Electronically Signed: Elon Spanner ED Scribe. 02/16/2015. 4:07 PM.    Chief Complaint  Patient presents with  . Foot Pain   The history is provided by the patient. No language interpreter was used.    HPI Comments: Billy Peters is a 29 y.o. male who presents to the Emergency Department complaining of intermittent, moderate left foot pain, which started 3 weeks ago while playing basketball. He states his symptoms have been constant since that time. He reports his pain is worse in the morning. He denies alleviating factors. He denies numbness, weakness, paresthesia.   History reviewed. No pertinent past medical history. History reviewed. No pertinent past surgical history. No family history on file. Social History  Substance Use Topics  . Smoking status: Never Smoker   . Smokeless tobacco: None  . Alcohol Use: No     Review of Systems  Musculoskeletal: Positive for arthralgias. Negative for back pain, joint swelling, gait problem and neck pain.  Skin: Negative for color change.  Neurological: Negative for weakness and numbness.      Allergies  Review of patient's allergies indicates no known allergies.  Home Medications   Prior to Admission medications   Medication Sig Start Date End Date Taking? Authorizing Provider  HYDROcodone-acetaminophen (NORCO/VICODIN) 5-325 MG per tablet Take 1 tablet by mouth every 4 (four) hours as needed. 11/26/13   Ivery Quale, PA-C  ketorolac (TORADOL) 10 MG tablet Take 1 tablet (10 mg total) by mouth 3 (three) times daily. 11/26/13   Ivery Quale, PA-C  methocarbamol (ROBAXIN) 500 MG tablet Take 1 tablet (500 mg total) by mouth 3 (three) times daily. 11/26/13   Ivery Quale, PA-C    BP 130/79 mmHg  Pulse 67   Temp(Src) 98.2 F (36.8 C) (Oral)  Resp 16  SpO2 100% Physical Exam  Constitutional: He is oriented to person, place, and time. He appears well-developed and well-nourished. No distress.  HENT:  Head: Normocephalic and atraumatic.  Right Ear: External ear normal.  Left Ear: External ear normal.  Nose: Nose normal.  Mouth/Throat: Oropharynx is clear and moist.  Eyes: Conjunctivae and EOM are normal. Pupils are equal, round, and reactive to light. Right eye exhibits no discharge. Left eye exhibits no discharge. No scleral icterus.  Neck: Normal range of motion. Neck supple.  Cardiovascular: Normal rate and regular rhythm.   Pulmonary/Chest: Effort normal and breath sounds normal. No respiratory distress.  Musculoskeletal: Normal range of motion. He exhibits tenderness. He exhibits no edema.  Mild TTP of lateral dorsal aspect of left foot. Full range of motion. Strength and sensation intact. Distal pulses intact. Cap refill <3 seconds.  Neurological: He is alert and oriented to person, place, and time. He has normal strength. No sensory deficit.  Skin: Skin is warm and dry. He is not diaphoretic. No erythema.  Psychiatric: He has a normal mood and affect. His behavior is normal.  Nursing note and vitals reviewed.   ED Course  Procedures (including critical care time)  DIAGNOSTIC STUDIES: Oxygen Saturation is 100% on RA, normal by my interpretation.    COORDINATION OF CARE: 4:07 PM Discussed treatment plan with patient at bedside.  Patient acknowledges and agrees with plan.   Labs Review Labs Reviewed - No data to display  Imaging  Review Dg Foot Complete Left  02/16/2015   CLINICAL DATA:  A person fell on the patient's left foot 3 weeks ago. Persistent pain proximally along the left fourth and fifth metatarsal region.  EXAM: LEFT FOOT - COMPLETE 3+ VIEW  COMPARISON:  None.  FINDINGS: Lisfranc joint alignment normal. No metatarsal fracture is identified. There may be mild soft tissue  swelling in the vicinity of the fifth MTP joint, but no foreign body is observed.  IMPRESSION: 1. No acute bony findings. If pain persists despite conservative therapy, MRI may be warranted for further characterization. 2. Potential mild soft tissue swelling along the lateral margin of the fifth MTP joint.   Electronically Signed   By: Gaylyn Rong M.D.   On: 02/16/2015 14:36   I have personally reviewed and evaluated these image results as part of my medical decision-making.   EKG Interpretation None      MDM   Final diagnoses:  Left foot pain    29 year male presents with left foot pain x 3 weeks. He denies numbness, weakness, paresthesia.   Patient is afebrile. Vital signs stable. Mild TTP of lateral dorsal aspect of left foot. Full range of motion. Strength and sensation intact. Distal pulses intact. Cap refill <3 seconds.  Imaging of left foot negative for acute bony findings, demonstrates potential mild soft tissue swelling along lateral margin of 5th MTP.  Patient given post-op shoe. Will treat pain with ibuprofen. Patient to follow-up with orthopedist for persistent symptoms. Return precautions discussed.  I personally performed the services described in this documentation, which was scribed in my presence. The recorded information has been reviewed and is accurate.  BP 130/79 mmHg  Pulse 67  Temp(Src) 98.2 F (36.8 C) (Oral)  Resp 16  SpO2 100%     Mady Gemma, PA-C 02/16/15 2339  Benjiman Core, MD 02/17/15 [id number]

## 2020-05-25 ENCOUNTER — Encounter (HOSPITAL_COMMUNITY): Payer: Self-pay | Admitting: Emergency Medicine

## 2020-05-25 ENCOUNTER — Emergency Department (HOSPITAL_COMMUNITY)
Admission: EM | Admit: 2020-05-25 | Discharge: 2020-05-25 | Disposition: A | Discharge status: Home / Self Care | Payer: HRSA Program | Attending: Emergency Medicine | Admitting: Emergency Medicine

## 2020-05-25 ENCOUNTER — Other Ambulatory Visit: Payer: Self-pay

## 2020-05-25 DIAGNOSIS — M549 Dorsalgia, unspecified: Secondary | ICD-10-CM | POA: Diagnosis present

## 2020-05-25 DIAGNOSIS — U071 COVID-19: Secondary | ICD-10-CM | POA: Insufficient documentation

## 2020-05-25 DIAGNOSIS — B349 Viral infection, unspecified: Secondary | ICD-10-CM

## 2020-05-25 LAB — SARS CORONAVIRUS 2 (TAT 6-24 HRS): SARS Coronavirus 2: POSITIVE — AB

## 2020-05-25 LAB — URINALYSIS, ROUTINE W REFLEX MICROSCOPIC
Bilirubin Urine: NEGATIVE
Glucose, UA: NEGATIVE mg/dL
Hgb urine dipstick: NEGATIVE
Ketones, ur: NEGATIVE mg/dL
Leukocytes,Ua: NEGATIVE
Nitrite: NEGATIVE
Protein, ur: NEGATIVE mg/dL
Specific Gravity, Urine: 1.017 (ref 1.005–1.030)
pH: 7 (ref 5.0–8.0)

## 2020-05-25 LAB — POC SARS CORONAVIRUS 2 AG -  ED: SARS Coronavirus 2 Ag: NEGATIVE

## 2020-05-25 MED ORDER — ACETAMINOPHEN 500 MG PO TABS: 1000.0000 mg | ORAL_TABLET | Freq: Once | ORAL | Status: DC

## 2020-05-25 MED ORDER — IBUPROFEN 800 MG PO TABS
800.0000 mg | ORAL_TABLET | Freq: Once | ORAL | Status: AC
  Administered 2020-05-25: 800 mg via ORAL
  Filled 2020-05-25: qty 1

## 2020-05-25 NOTE — ED Notes (Signed)
An After Visit Summary was printed and given to the patient. Discharge instructions given and no further questions at this time.  

## 2020-05-25 NOTE — Discharge Instructions (Signed)
Use Tylenol and ibuprofen as directed for your discomfort.

## 2020-05-25 NOTE — ED Triage Notes (Signed)
Per EMS-woke up this am with back pain and fever-took Advil 600 mg at home-tylenol 100 mg and 1000 cc of fluid given in route

## 2020-05-25 NOTE — ED Provider Notes (Signed)
[facility]-EMERGENCY DEPT Provider Note   CSN: [id number] Arrival date & time: 05/25/20  1222     History Chief Complaint  Patient presents with  . Back Pain  . Fever    Billy Peters is a 35 y.o. male.  35 year old male who presents with diffuse back pain and fever which began this morning.  Patient denies any neck pain or photophobia.  Only endorses a mild headache.  No lower extremity weakness.  Denies any abdominal or chest discomfort.  No URI symptoms.  Denies any rashes.  Called EMS and given IV fluids and Tylenol and transported here.        History reviewed. No pertinent past medical history.  Patient Active Problem List   Diagnosis Date Noted  . ANKLE SPRAIN, RIGHT 06/11/2009  . KNEE PAIN, RIGHT 08/28/2006    No past surgical history on file.     No family history on file.  Social History   Tobacco Use  . Smoking status: Never Smoker  Substance Use Topics  . Alcohol use: No  . Drug use: No    Home Medications Prior to Admission medications   Medication Sig Start Date End Date Taking? Authorizing Provider  HYDROcodone-acetaminophen (NORCO/VICODIN) 5-325 MG per tablet Take 1 tablet by mouth every 4 (four) hours as needed. 11/26/13   Ivery Quale, PA-C  ibuprofen (ADVIL,MOTRIN) 800 MG tablet Take 1 tablet (800 mg total) by mouth 3 (three) times daily. 02/16/15   Mady Gemma, PA-C  ketorolac (TORADOL) 10 MG tablet Take 1 tablet (10 mg total) by mouth 3 (three) times daily. 11/26/13   Ivery Quale, PA-C  methocarbamol (ROBAXIN) 500 MG tablet Take 1 tablet (500 mg total) by mouth 3 (three) times daily. 11/26/13   Ivery Quale, PA-C    Allergies    Patient has no known allergies.  Review of Systems   Review of Systems  All other systems reviewed and are negative.   Physical Exam Updated Vital Signs BP (!) 143/86 (BP Location: Right Arm)   Pulse (!) 115   Temp 98.3 F (36.8 C) (Oral)   Resp 18   SpO2 98%    Physical Exam Vitals and nursing note reviewed.  Constitutional:      General: He is not in acute distress.    Appearance: Normal appearance. He is well-developed and well-nourished. He is not toxic-appearing.  HENT:     Head: Normocephalic and atraumatic.  Eyes:     General: Lids are normal.     Extraocular Movements: EOM normal.     Conjunctiva/sclera: Conjunctivae normal.     Pupils: Pupils are equal, round, and reactive to light.  Neck:     Thyroid: No thyroid mass.     Trachea: No tracheal deviation.  Cardiovascular:     Rate and Rhythm: Normal rate and regular rhythm.     Heart sounds: Normal heart sounds. No murmur heard. No gallop.   Pulmonary:     Effort: Pulmonary effort is normal. No respiratory distress.     Breath sounds: Normal breath sounds. No stridor. No decreased breath sounds, wheezing, rhonchi or rales.  Abdominal:     General: Bowel sounds are normal. There is no distension.     Palpations: Abdomen is soft.     Tenderness: There is no abdominal tenderness. There is no CVA tenderness or rebound.  Musculoskeletal:        General: No tenderness or edema. Normal range of motion.     Cervical  back: Full passive range of motion without pain, normal range of motion and neck supple.  Skin:    General: Skin is warm and dry.     Findings: No abrasion or rash.  Neurological:     General: No focal deficit present.     Mental Status: He is alert and oriented to person, place, and time.     GCS: GCS eye subscore is 4. GCS verbal subscore is 5. GCS motor subscore is 6.     Cranial Nerves: No cranial nerve deficit.     Sensory: No sensory deficit.     Gait: Gait is intact.     Deep Tendon Reflexes: Strength normal.  Psychiatric:        Mood and Affect: Mood and affect normal.        Speech: Speech normal.        Behavior: Behavior normal.     ED Results / Procedures / Treatments   Labs (all labs ordered are listed, but only abnormal results are  displayed) Labs Reviewed  URINE CULTURE  URINALYSIS, ROUTINE W REFLEX MICROSCOPIC  POC SARS CORONAVIRUS 2 AG -  ED    EKG None  Radiology No results found.  Procedures Procedures (including critical care time)  Medications Ordered in ED Medications  acetaminophen (TYLENOL) tablet 1,000 mg (has no administration in time range)    ED Course  I have reviewed the triage vital signs and the nursing notes.  Pertinent labs & imaging results that were available during my care of the patient were reviewed by me and considered in my medical decision making (see chart for details).    MDM Rules/Calculators/A&P                          Patient given ibuprofen here and does feel better.  Urinalysis negative.  Rapid COVID test is negative.  Patient does not use IV drugs and therefore low suspicion for any severe spinal pathology.  Neurological assessment is normal.  Likely viral illness and will discharge Final Clinical Impression(s) / ED Diagnoses Final diagnoses:  None    Rx / DC Orders ED Discharge Orders    None       Lorre Nick, MD 05/25/20 1619

## 2020-05-26 ENCOUNTER — Telehealth (HOSPITAL_COMMUNITY): Payer: Self-pay

## 2020-05-27 LAB — URINE CULTURE: Culture: NO GROWTH

## 2022-10-19 ENCOUNTER — Encounter: Payer: Self-pay | Admitting: Family Medicine

## 2022-10-19 ENCOUNTER — Ambulatory Visit (INDEPENDENT_AMBULATORY_CARE_PROVIDER_SITE_OTHER): Payer: Managed Care, Other (non HMO) | Admitting: Family Medicine

## 2022-10-19 VITALS — BP 116/82 | HR 59 | Temp 98.1°F | Ht 71.75 in | Wt 214.3 lb

## 2022-10-19 DIAGNOSIS — G8929 Other chronic pain: Secondary | ICD-10-CM | POA: Diagnosis not present

## 2022-10-19 DIAGNOSIS — M545 Low back pain, unspecified: Secondary | ICD-10-CM

## 2022-10-19 DIAGNOSIS — Z8249 Family history of ischemic heart disease and other diseases of the circulatory system: Secondary | ICD-10-CM

## 2022-10-19 LAB — LIPID PANEL
Cholesterol: 159 mg/dL (ref 0–200)
HDL: 32.9 mg/dL — ABNORMAL LOW (ref >39.00)
LDL Cholesterol: 99 mg/dL (ref 0–99)
NonHDL: 126.04
Total CHOL/HDL Ratio: 5
Triglycerides: 134 mg/dL (ref 0.0–149.0)
VLDL: 26.8 mg/dL (ref 0.0–40.0)

## 2022-10-19 MED ORDER — METHOCARBAMOL 500 MG PO TABS: 500.0000 mg | ORAL_TABLET | Freq: Every day | ORAL | 0 refills | Status: DC

## 2022-10-19 NOTE — Assessment & Plan Note (Signed)
No acute exam findings, I recommended having muscle relaxer to use as needed at home for severe pain. Script sent to pharmacy.

## 2022-10-19 NOTE — Progress Notes (Signed)
New Patient Office Visit  Subjective    Patient ID: Billy Peters, male    DOB: 1986-03-25  Age: 37 y.o. MRN: [id number]  CC:  Chief Complaint  Patient presents with   Establish Care    HPI Billy Peters presents to establish care Patient has a history of "enlarged heart", never had any symptoms like shortness of breath, no difficulty with exercise. States that he was an athlete in high school but the doctors told him he couldn't participate anymore due to the enlargement of his heart. Patient is a truck driver currently, doesn't engage in heavy labor or heavy lifting. Patient reports no cardiac symptoms but he does say that his father passed away in hits 30's due to a heart attack.   Patient is reporting chronic history of lower back pain. States he did go to a Land which did help. States that he does sit a lot for his job, usually sitting for 4-5 hours before he can take a break when on the road. Pain does not radiate down the legs,mostly in the center of his back.   Current Outpatient Medications  Medication Instructions   methocarbamol (ROBAXIN) 500 mg, Oral, Daily at bedtime    Past Medical History:  Diagnosis Date   Enlarged heart    diagnosed at age 70    History reviewed. No pertinent surgical history.  Family History  Problem Relation Age of Onset   Drug abuse Mother    Cancer Mother    Alcohol abuse Mother    Early death Father    Heart attack Father     Social History   Socioeconomic History   Marital status: Married    Spouse name: Not on file   Number of children: Not on file   Years of education: Not on file   Highest education level: Not on file  Occupational History   Not on file  Tobacco Use   Smoking status: Never   Smokeless tobacco: Never  Vaping Use   Vaping Use: Former  Substance and Sexual Activity   Alcohol use: Yes   Drug use: Not Currently   Sexual activity: Yes  Other Topics Concern   Not on file  Social  History Narrative   Not on file   Social Determinants of Health   Financial Resource Strain: Not on file  Food Insecurity: Not on file  Transportation Needs: Not on file  Physical Activity: Not on file  Stress: Not on file  Social Connections: Not on file  Intimate Partner Violence: Not on file    Review of Systems  All other systems reviewed and are negative.       Objective    BP 116/82 (BP Location: Left Arm, Patient Position: Sitting, Cuff Size: Large)   Pulse (!) 59   Temp 98.1 F (36.7 C) (Oral)   Ht 5' 11.75" (1.822 m)   Wt 214 lb 4.8 oz (97.2 kg)   SpO2 98%   BMI 29.27 kg/m   Physical Exam Vitals reviewed.  Constitutional:      Appearance: Normal appearance. He is well-groomed and normal weight.  Eyes:     Conjunctiva/sclera: Conjunctivae normal.  Neck:     Thyroid: No thyromegaly.  Cardiovascular:     Rate and Rhythm: Normal rate and regular rhythm.     Heart sounds: S1 normal and S2 normal. No murmur heard. Pulmonary:     Effort: Pulmonary effort is normal.     Breath sounds: Normal breath  sounds and air entry. No rales.  Abdominal:     General: Bowel sounds are normal.  Musculoskeletal:     Right lower leg: No edema.     Left lower leg: No edema.  Neurological:     General: No focal deficit present.     Mental Status: He is alert and oriented to person, place, and time.     Gait: Gait is intact.  Psychiatric:        Mood and Affect: Mood and affect normal.         Assessment & Plan:  Family history of heart disease in male family member before age 40 -    Father passed away very young from a heart attack, mother recently had open heart surgery. He has no symptoms at this time, will continue to monitor cardiac status and will assess his overall risk by checking a lipid panel today.    Lipid panel  Chronic midline low back pain without sciatica Assessment & Plan: No acute exam findings, I recommended having muscle relaxer to use as  needed at home for severe pain. Script sent to pharmacy.   Orders: -     Methocarbamol; Take 1 tablet (500 mg total) by mouth at bedtime.  Dispense: 20 tablet; Refill: 0    Return in about 1 year (around 10/19/2023) for annual physical exam.   Karie Georges, MD

## 2023-02-22 ENCOUNTER — Encounter: Payer: Self-pay | Admitting: Family Medicine

## 2023-03-14 ENCOUNTER — Encounter: Payer: Self-pay | Admitting: Family Medicine

## 2023-03-14 ENCOUNTER — Ambulatory Visit (INDEPENDENT_AMBULATORY_CARE_PROVIDER_SITE_OTHER): Payer: Managed Care, Other (non HMO) | Admitting: Family Medicine

## 2023-03-14 VITALS — BP 128/88 | HR 80 | Temp 98.4°F | Ht 71.75 in | Wt 216.0 lb

## 2023-03-14 DIAGNOSIS — G8929 Other chronic pain: Secondary | ICD-10-CM | POA: Diagnosis not present

## 2023-03-14 DIAGNOSIS — M898X1 Other specified disorders of bone, shoulder: Secondary | ICD-10-CM

## 2023-03-14 DIAGNOSIS — R0789 Other chest pain: Secondary | ICD-10-CM

## 2023-03-14 MED ORDER — CYCLOBENZAPRINE HCL 10 MG PO TABS
10.0000 mg | ORAL_TABLET | Freq: Three times a day (TID) | ORAL | 0 refills | Status: AC | PRN
Start: 2023-03-14 — End: ?

## 2023-03-14 MED ORDER — KETOROLAC TROMETHAMINE 60 MG/2ML IM SOLN
60.0000 mg | Freq: Once | INTRAMUSCULAR | Status: AC
Start: 2023-03-14 — End: 2023-03-14
  Administered 2023-03-14: 60 mg via INTRAMUSCULAR

## 2023-03-14 MED ORDER — METHYLPREDNISOLONE 4 MG PO TBPK
ORAL_TABLET | ORAL | 0 refills | Status: DC
Start: 2023-03-14 — End: 2023-05-16

## 2023-03-14 NOTE — Progress Notes (Unsigned)
   Acute Office Visit  Subjective:     Patient ID: Billy Peters, male    DOB: 1985-08-20, 37 y.o.   MRN: [id number]  Chief Complaint  Patient presents with  . Back Pain    Patient complains of upper left sided back/scapular pain x1 month, also complains of pain when lifting arm over head, tried muscle relaxers with no relief, no known injury    Back Pain  Patient is in today for left sided back/scapular pain for 1 month. States that there was no injury, state she just woke up like this. States that any movement, turning head to the left and lifting his arm is painful. States that he cannot lay down on his back due to the pain. Patient states that he has been taking tylenol, ibuprofen, and the muscle relaxers. Pt reports that he doesn't feel unstable. States that hot showers and massage do help, states that his wife felt "knots" in the shoulder. The pain is sharp when it shoots through, sometimes hurts with breathing. 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   Review of Systems  Musculoskeletal:  Positive for back pain.  All other systems reviewed and are negative.       Objective:    BP 128/88 (BP Location: Right Arm, Patient Position: Sitting, Cuff Size: Large)   Pulse 80   Temp 98.4 F (36.9 C) (Oral)   Ht 5' 11.75" (1.822 m)   Wt 216 lb (98 kg)   SpO2 98%   BMI 29.50 kg/m  {Vitals History (Optional):23777}  Physical Exam Vitals reviewed.  Constitutional:      Appearance: Normal appearance. He is normal weight.  Neurological:     Mental Status: He is alert.    No results found for any visits on 03/14/23.      Assessment & Plan:   Problem List Items Addressed This Visit   None Visit Diagnoses     Chronic scapular pain    -  Primary   Relevant Medications   cyclobenzaprine (FLEXERIL) 10 MG tablet   methylPREDNISolone (MEDROL DOSEPAK) 4 MG TBPK tablet   ketorolac (TORADOL) injection 60 mg (Start on 03/14/2023  5:15 PM)   Other Relevant Orders    DG Scapula Left   Chest wall pain       Relevant Orders   DG Chest 2 View       Meds ordered this encounter  Medications  . cyclobenzaprine (FLEXERIL) 10 MG tablet    Sig: Take 1 tablet (10 mg total) by mouth 3 (three) times daily as needed for muscle spasms.    Dispense:  30 tablet    Refill:  0  . methylPREDNISolone (MEDROL DOSEPAK) 4 MG TBPK tablet    Sig: Take package as directed    Dispense:  21 each    Refill:  0  . ketorolac (TORADOL) injection 60 mg    Return if symptoms worsen or fail to improve.  Karie Georges, MD

## 2023-03-15 ENCOUNTER — Other Ambulatory Visit: Payer: Managed Care, Other (non HMO)

## 2023-03-15 ENCOUNTER — Ambulatory Visit: Payer: Managed Care, Other (non HMO)

## 2023-03-15 DIAGNOSIS — G8929 Other chronic pain: Secondary | ICD-10-CM | POA: Diagnosis not present

## 2023-03-15 DIAGNOSIS — R0789 Other chest pain: Secondary | ICD-10-CM

## 2023-03-15 DIAGNOSIS — M898X1 Other specified disorders of bone, shoulder: Secondary | ICD-10-CM | POA: Diagnosis not present

## 2023-04-24 ENCOUNTER — Encounter: Payer: Self-pay | Admitting: Family Medicine

## 2023-04-24 DIAGNOSIS — Z3009 Encounter for other general counseling and advice on contraception: Secondary | ICD-10-CM

## 2023-04-25 NOTE — Telephone Encounter (Signed)
Ok to send referral for vasectomy-- please have patient come in for an appointment due to the night sweats-- I need to perform a thorough physical and order testing for this very concerning symptom.

## 2023-05-16 ENCOUNTER — Ambulatory Visit (INDEPENDENT_AMBULATORY_CARE_PROVIDER_SITE_OTHER): Payer: Managed Care, Other (non HMO) | Admitting: Family Medicine

## 2023-05-16 ENCOUNTER — Encounter: Payer: Self-pay | Admitting: Family Medicine

## 2023-05-16 VITALS — BP 112/78 | HR 85 | Temp 98.3°F | Ht 71.75 in | Wt 212.2 lb

## 2023-05-16 DIAGNOSIS — Z114 Encounter for screening for human immunodeficiency virus [HIV]: Secondary | ICD-10-CM

## 2023-05-16 DIAGNOSIS — Z1159 Encounter for screening for other viral diseases: Secondary | ICD-10-CM

## 2023-05-16 DIAGNOSIS — Z3009 Encounter for other general counseling and advice on contraception: Secondary | ICD-10-CM | POA: Diagnosis not present

## 2023-05-16 DIAGNOSIS — R61 Generalized hyperhidrosis: Secondary | ICD-10-CM | POA: Diagnosis not present

## 2023-05-16 LAB — URINALYSIS, ROUTINE W REFLEX MICROSCOPIC
Bilirubin Urine: NEGATIVE
Hgb urine dipstick: NEGATIVE
Ketones, ur: NEGATIVE
Leukocytes,Ua: NEGATIVE
Nitrite: NEGATIVE
Specific Gravity, Urine: >=1.03 — AB (ref 1.000–1.030)
Urine Glucose: NEGATIVE
Urobilinogen, UA: 0.2 (ref 0.0–1.0)
pH: 6 (ref 5.0–8.0)

## 2023-05-16 LAB — CBC WITH DIFFERENTIAL/PLATELET
Basophils Absolute: 0.1 10*3/uL (ref 0.0–0.1)
Basophils Relative: 0.8 % (ref 0.0–3.0)
Eosinophils Absolute: 0.1 10*3/uL (ref 0.0–0.7)
Eosinophils Relative: 1.8 % (ref 0.0–5.0)
HCT: 44.2 % (ref 39.0–52.0)
Hemoglobin: 14.6 g/dL (ref 13.0–17.0)
Lymphocytes Relative: 24.2 % (ref 12.0–46.0)
Lymphs Abs: 1.8 10*3/uL (ref 0.7–4.0)
MCHC: 33.1 g/dL (ref 30.0–36.0)
MCV: 88.1 fL (ref 78.0–100.0)
Monocytes Absolute: 0.5 10*3/uL (ref 0.1–1.0)
Monocytes Relative: 6.4 % (ref 3.0–12.0)
Neutro Abs: 4.8 10*3/uL (ref 1.4–7.7)
Neutrophils Relative %: 66.8 % (ref 43.0–77.0)
Platelets: 213 10*3/uL (ref 150.0–400.0)
RBC: 5.01 Mil/uL (ref 4.22–5.81)
RDW: 14 % (ref 11.5–15.5)
WBC: 7.3 10*3/uL (ref 4.0–10.5)

## 2023-05-16 LAB — COMPREHENSIVE METABOLIC PANEL
ALT: 16 U/L (ref 0–53)
AST: 17 U/L (ref 0–37)
Albumin: 4.7 g/dL (ref 3.5–5.2)
Alkaline Phosphatase: 40 U/L (ref 39–117)
BUN: 13 mg/dL (ref 6–23)
CO2: 28 meq/L (ref 19–32)
Calcium: 9.9 mg/dL (ref 8.4–10.5)
Chloride: 104 meq/L (ref 96–112)
Creatinine, Ser: 1.41 mg/dL (ref 0.40–1.50)
GFR: 64.69 mL/min (ref >60.00)
Glucose, Bld: 88 mg/dL (ref 70–99)
Potassium: 4.1 meq/L (ref 3.5–5.1)
Sodium: 141 meq/L (ref 135–145)
Total Bilirubin: 0.5 mg/dL (ref 0.2–1.2)
Total Protein: 7.2 g/dL (ref 6.0–8.3)

## 2023-05-16 LAB — TSH: TSH: 1.4 u[IU]/mL (ref 0.35–5.50)

## 2023-05-16 LAB — CK: Total CK: 206 U/L (ref 7–232)

## 2023-05-16 LAB — C-REACTIVE PROTEIN: CRP: <1 mg/dL (ref 0.5–20.0)

## 2023-05-16 NOTE — Progress Notes (Signed)
 Established Patient Office Visit  Subjective   Patient ID: Billy Peters, male    DOB: [birth year]  Age: 38 y.o. MRN: [id number]  Chief Complaint  Patient presents with   Night Sweats    X2 years, worse past 6 months, states he has not checked his temperature    Pt is reporting that he has been having worsening night sweats that have been going on for the last 2 years but they have worsened over the last 6 months, states that they happen about 4/7 days in the week. When he wakes up out of his sleep feeling very wet, face and chest, almost feels like a head cold almost, no snoring at night. Sleeps with a fan facing him at night. No history of international travel, hasn't been exposed to any infections, no chemical or occupational exposure, states that it did happen while he was awake at one time during a disagreement with his wife. Wakes up with achiness and soreness all over. No sinus congestion, no coughing, no difficulty urinating. He denies any STI exposure, no open wounds on the skin, no history of IVDA, no history of tick bites. The only other symptom he reports his chronic low back pain, denies radiculopathy. He does have numerous tattoos and piercings.    Current Outpatient Medications  Medication Instructions   cyclobenzaprine  (FLEXERIL ) 10 mg, Oral, 3 times daily PRN    Patient Active Problem List   Diagnosis Date Noted   Chronic back pain 10/19/2022   Sprain of ankle 06/11/2009   KNEE PAIN, RIGHT 08/28/2006      Review of Systems  All other systems reviewed and are negative.     Objective:     BP 112/78   Pulse 85   Temp 98.3 F (36.8 C) (Oral)   Ht 5' 11.75 (1.822 m)   Wt 212 lb 3.2 oz (96.3 kg)   SpO2 98%   BMI 28.98 kg/m    Physical Exam Vitals reviewed.  Constitutional:      Appearance: Normal appearance. He is normal weight.  Cardiovascular:     Rate and Rhythm: Normal rate and regular rhythm.     Pulses: Normal pulses.     Heart  sounds: Normal heart sounds. No murmur heard. Pulmonary:     Effort: Pulmonary effort is normal.     Breath sounds: Normal breath sounds. No wheezing or rales.  Abdominal:     General: Bowel sounds are normal.  Musculoskeletal:     Right lower leg: No edema.     Left lower leg: No edema.  Neurological:     Mental Status: He is alert and oriented to person, place, and time. Mental status is at baseline.      No results found for any visits on 05/16/23.    The ASCVD Risk score (Arnett DK, et al., 2019) failed to calculate for the following reasons:   The 2019 ASCVD risk score is only valid for ages 76 to 54    Assessment & Plan:  Unexplained night sweats -     CBC with Differential/Platelet -     Comprehensive metabolic panel -     ANA w/Reflex; Future -     CK -     TSH -     QuantiFERON-TB Gold Plus -     C-reactive protein -     Urinalysis, Routine w reflex microscopic -     RPR  Encounter for screening for HIV -  HIV Antibody (routine testing w rflx)  Need for hepatitis C screening test -     Hepatitis C antibody   Unexplained night sweats-- differential dx is very broad and could be caused by many things. I am recommending an initial infectious work up and screenings with ANA and CRP, as well as CK due to the muscle aches/ chronic back pain. Pt had CXR 2 months ago which was WNL. Once the results become available it should help us  narrow down the differential and then more testing may be ordered. I have also advised the patient to start recording his temperatures at night when he has the night sweats.   No follow-ups on file.    Heron CHRISTELLA Sharper, MD

## 2023-05-17 LAB — ANA W/REFLEX: Anti Nuclear Antibody (ANA): NEGATIVE

## 2023-05-18 ENCOUNTER — Encounter: Payer: Self-pay | Admitting: Family Medicine

## 2023-05-18 LAB — QUANTIFERON-TB GOLD PLUS
Mitogen-NIL: >10 [IU]/mL
NIL: 0.03 [IU]/mL
QuantiFERON-TB Gold Plus: NEGATIVE
TB1-NIL: 0 [IU]/mL
TB2-NIL: 0.01 [IU]/mL

## 2023-05-18 LAB — HEPATITIS C ANTIBODY: Hepatitis C Ab: NONREACTIVE

## 2023-05-18 LAB — RPR: RPR Ser Ql: NONREACTIVE

## 2023-05-18 LAB — HIV ANTIBODY (ROUTINE TESTING W REFLEX): HIV 1&2 Ab, 4th Generation: NONREACTIVE

## 2023-05-23 ENCOUNTER — Ambulatory Visit: Payer: Managed Care, Other (non HMO) | Admitting: Family Medicine

## 2023-10-25 ENCOUNTER — Encounter: Payer: Managed Care, Other (non HMO) | Admitting: Family Medicine
